# Patient Record
Sex: Female | Born: 2000 | Race: White | Hispanic: No | Marital: Single | Smoking: Never smoker
Health system: Southern US, Community
[De-identification: ages and names within clinical notes are randomized; demographics above are authoritative.]

## PROBLEM LIST (undated history)

## (undated) DIAGNOSIS — F32A Depression, unspecified: Secondary | ICD-10-CM

## (undated) DIAGNOSIS — E538 Deficiency of other specified B group vitamins: Secondary | ICD-10-CM

## (undated) DIAGNOSIS — Z87440 Personal history of urinary (tract) infections: Secondary | ICD-10-CM

## (undated) DIAGNOSIS — D510 Vitamin B12 deficiency anemia due to intrinsic factor deficiency: Secondary | ICD-10-CM

## (undated) DIAGNOSIS — J45909 Unspecified asthma, uncomplicated: Secondary | ICD-10-CM

## (undated) DIAGNOSIS — F909 Attention-deficit hyperactivity disorder, unspecified type: Secondary | ICD-10-CM

## (undated) DIAGNOSIS — F419 Anxiety disorder, unspecified: Secondary | ICD-10-CM

## (undated) DIAGNOSIS — F329 Major depressive disorder, single episode, unspecified: Secondary | ICD-10-CM

## (undated) DIAGNOSIS — Q796 Ehlers-Danlos syndrome, unspecified: Secondary | ICD-10-CM

## (undated) DIAGNOSIS — E785 Hyperlipidemia, unspecified: Secondary | ICD-10-CM

## (undated) HISTORY — DX: Personal history of urinary (tract) infections: Z87.440

## (undated) HISTORY — DX: Deficiency of other specified B group vitamins: E53.8

## (undated) HISTORY — DX: Anxiety disorder, unspecified: F41.9

## (undated) HISTORY — DX: Attention-deficit hyperactivity disorder, unspecified type: F90.9

## (undated) HISTORY — DX: Depression, unspecified: F32.A

## (undated) HISTORY — DX: Vitamin B12 deficiency anemia due to intrinsic factor deficiency: D51.0

## (undated) HISTORY — DX: Unspecified asthma, uncomplicated: J45.909

## (undated) HISTORY — PX: FRENULECTOMY, LINGUAL: SHX1681

## (undated) HISTORY — DX: Major depressive disorder, single episode, unspecified: F32.9

## (undated) HISTORY — DX: Hyperlipidemia, unspecified: E78.5

## (undated) HISTORY — DX: Ehlers-Danlos syndrome, unspecified: Q79.60

---
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
## 2019-01-20 IMAGING — CR DG SHOULDER 2+V*L*
2 series · 2 of 2 positions shown · non-contrast
Comparison: None.

CLINICAL DATA: Initial evaluation for acute dislocation.

EXAM:
LEFT SHOULDER - 2+ VIEW

[w shoulder external left]
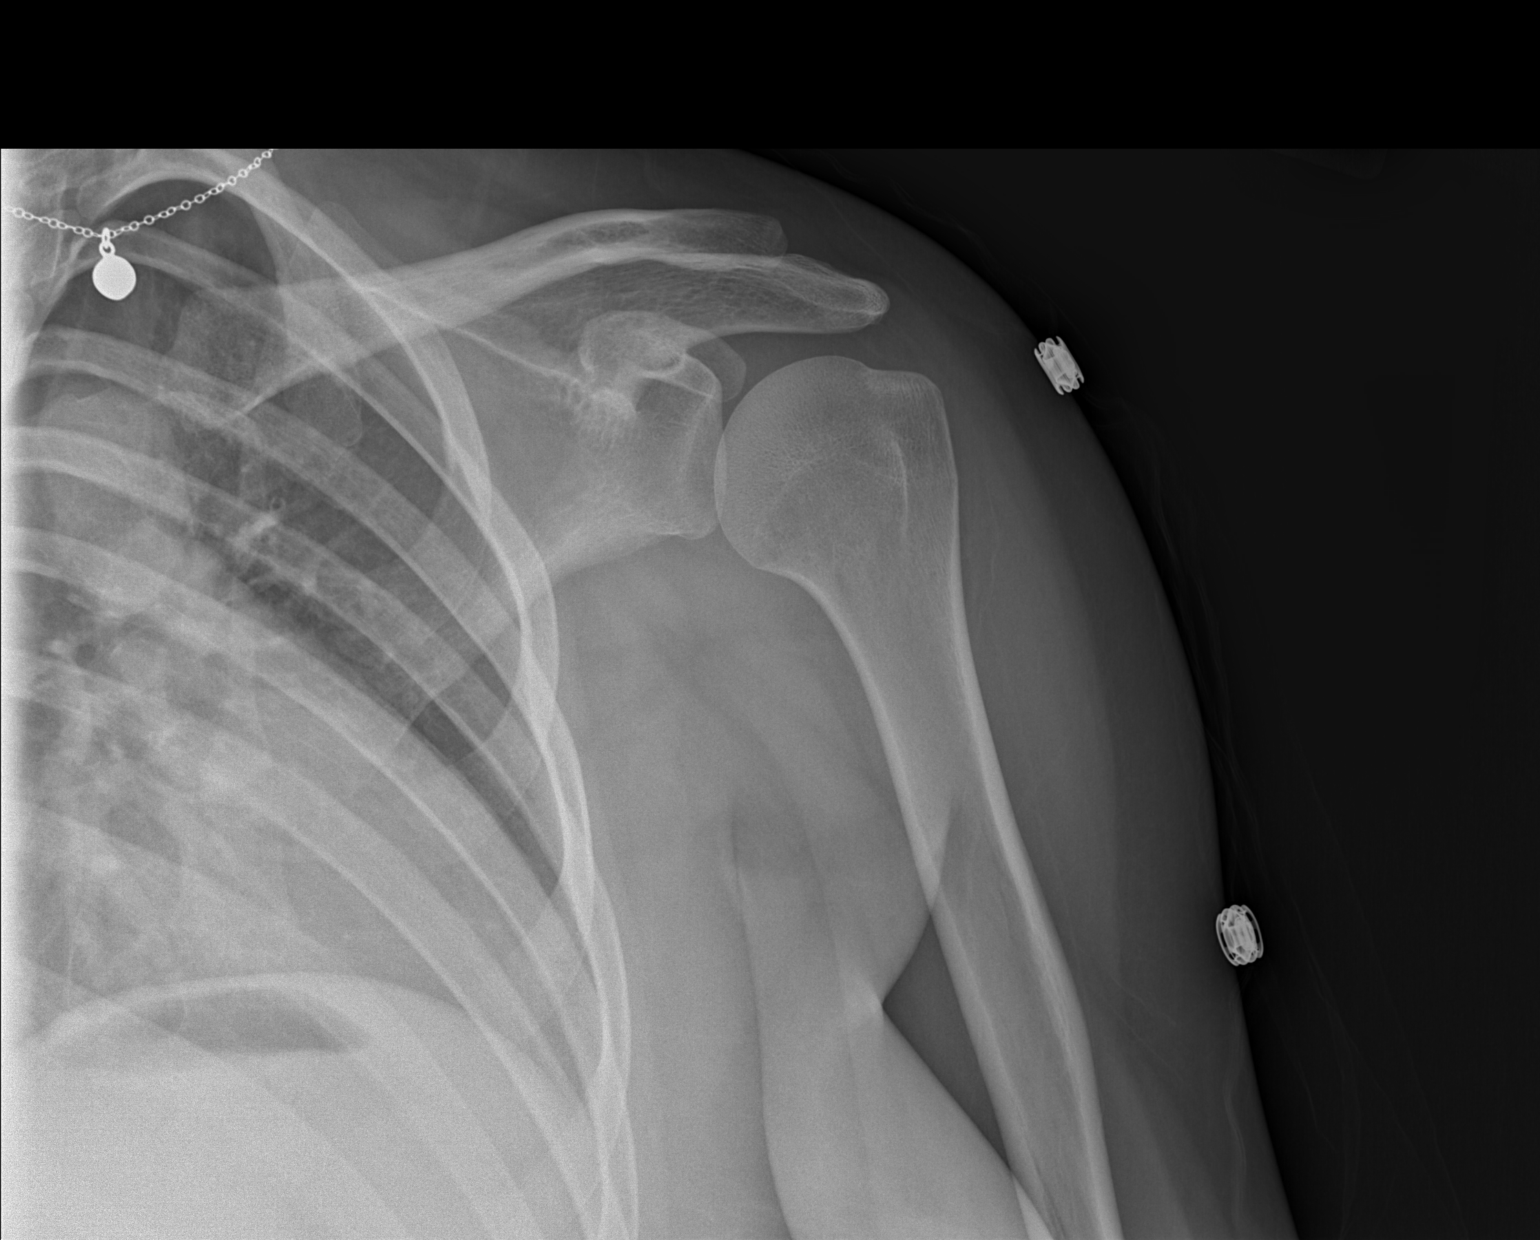

[w shoulder y-view left]
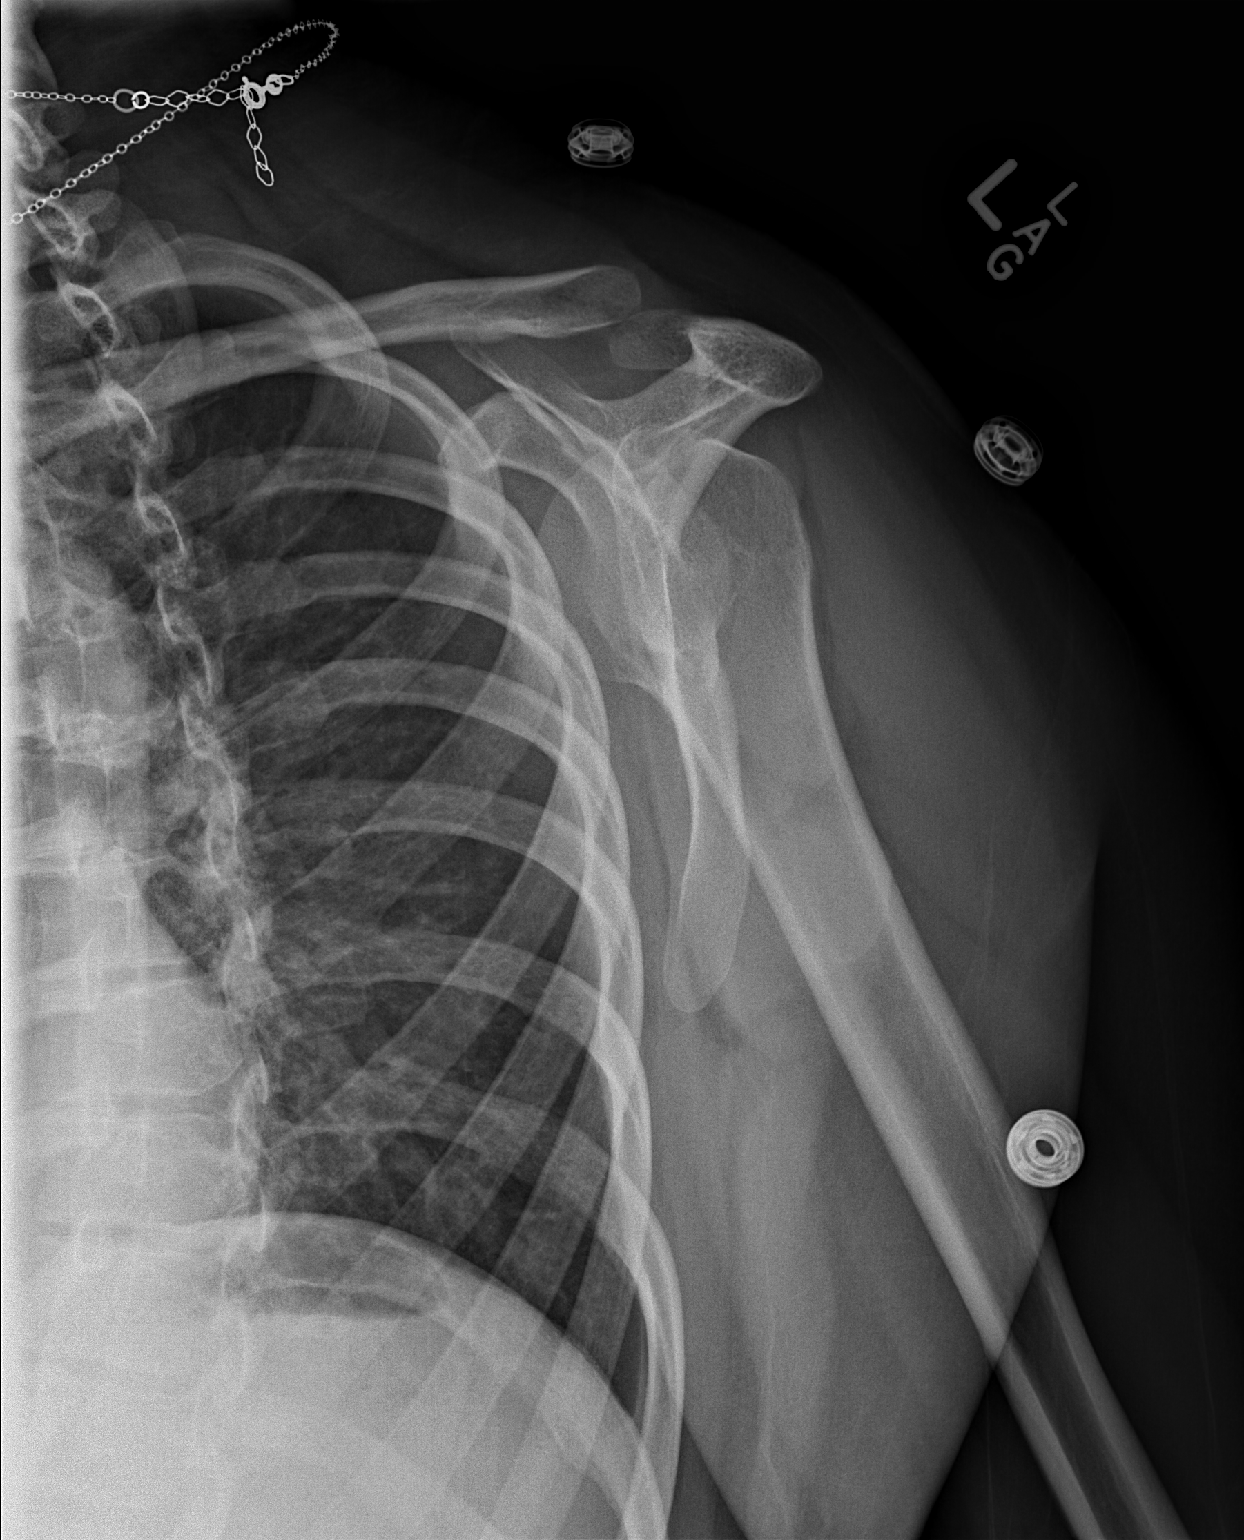

[2 of 2 positions shown; findings below may reference images not displayed]

FINDINGS: There is no evidence of fracture or dislocation. There is no
evidence of arthropathy or other focal bone abnormality. Soft
tissues are unremarkable.
IMPRESSION: No acute osseous abnormality about the left shoulder.
